# Patient Record
Sex: Male | Born: 1990 | Race: Black or African American | Hispanic: No | Marital: Single | Smoking: Current every day smoker
Health system: Southern US, Community
[De-identification: ages and names within clinical notes are randomized; demographics above are authoritative.]

## PROBLEM LIST (undated history)

## (undated) DIAGNOSIS — S02609A Fracture of mandible, unspecified, initial encounter for closed fracture: Secondary | ICD-10-CM

---
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
## 2010-01-20 ENCOUNTER — Emergency Department (HOSPITAL_COMMUNITY): Admission: EM | Admit: 2010-01-20 | Discharge: 2010-01-20 | Payer: Self-pay | Admitting: Emergency Medicine

## 2012-07-04 DIAGNOSIS — S02609A Fracture of mandible, unspecified, initial encounter for closed fracture: Secondary | ICD-10-CM

## 2012-07-04 HISTORY — DX: Fracture of mandible, unspecified, initial encounter for closed fracture: S02.609A

## 2012-07-05 ENCOUNTER — Observation Stay (HOSPITAL_COMMUNITY)
Admission: EM | Admit: 2012-07-05 | Discharge: 2012-07-06 | Disposition: A | Discharge status: Home / Self Care | Payer: Commercial Indemnity | Attending: Otolaryngology | Admitting: Otolaryngology

## 2012-07-05 ENCOUNTER — Encounter (HOSPITAL_COMMUNITY): Payer: Self-pay | Admitting: Anesthesiology

## 2012-07-05 ENCOUNTER — Encounter (HOSPITAL_COMMUNITY): Payer: Self-pay | Admitting: Emergency Medicine

## 2012-07-05 ENCOUNTER — Encounter (HOSPITAL_COMMUNITY)
Admission: EM | Disposition: A | Discharge status: Home / Self Care | Payer: Self-pay | Source: Home / Self Care | Attending: Emergency Medicine

## 2012-07-05 ENCOUNTER — Emergency Department (HOSPITAL_COMMUNITY): Payer: Commercial Indemnity

## 2012-07-05 ENCOUNTER — Observation Stay (HOSPITAL_COMMUNITY): Payer: Commercial Indemnity | Admitting: Anesthesiology

## 2012-07-05 DIAGNOSIS — S02609A Fracture of mandible, unspecified, initial encounter for closed fracture: Secondary | ICD-10-CM

## 2012-07-05 DIAGNOSIS — S02640A Fracture of ramus of mandible, unspecified side, initial encounter for closed fracture: Principal | ICD-10-CM | POA: Insufficient documentation

## 2012-07-05 DIAGNOSIS — Z23 Encounter for immunization: Secondary | ICD-10-CM | POA: Insufficient documentation

## 2012-07-05 DIAGNOSIS — Y9229 Other specified public building as the place of occurrence of the external cause: Secondary | ICD-10-CM | POA: Insufficient documentation

## 2012-07-05 DIAGNOSIS — F17200 Nicotine dependence, unspecified, uncomplicated: Secondary | ICD-10-CM | POA: Insufficient documentation

## 2012-07-05 DIAGNOSIS — Y998 Other external cause status: Secondary | ICD-10-CM | POA: Insufficient documentation

## 2012-07-05 HISTORY — PX: ORIF MANDIBULAR FRACTURE: SHX2127

## 2012-07-05 HISTORY — PX: CLOSED REDUCTION MANDIBLE WITH MANDIBULOMA: SHX5313

## 2012-07-05 LAB — CBC
HCT: 43.3 % (ref 39.0–52.0)
Hemoglobin: 15 g/dL (ref 13.0–17.0)
MCH: 30.9 pg (ref 26.0–34.0)
MCHC: 34.6 g/dL (ref 30.0–36.0)
MCV: 89.1 fL (ref 78.0–100.0)
Platelets: 194 10*3/uL (ref 150–400)
RBC: 4.86 MIL/uL (ref 4.22–5.81)
RDW: 13.6 % (ref 11.5–15.5)
WBC: 11.8 10*3/uL — ABNORMAL HIGH (ref 4.0–10.5)

## 2012-07-05 LAB — BASIC METABOLIC PANEL
BUN: 7 mg/dL (ref 6–23)
CO2: 25 mEq/L (ref 19–32)
Calcium: 9.7 mg/dL (ref 8.4–10.5)
Chloride: 102 mEq/L (ref 96–112)
Creatinine, Ser: 0.71 mg/dL (ref 0.50–1.35)
GFR calc Af Amer: >90 mL/min (ref >90)
GFR calc non Af Amer: >90 mL/min (ref >90)
Glucose, Bld: 95 mg/dL (ref 70–99)
Potassium: 3.5 mEq/L (ref 3.5–5.1)
Sodium: 141 mEq/L (ref 135–145)

## 2012-07-05 LAB — SURGICAL PCR SCREEN: Staphylococcus aureus: NEGATIVE

## 2012-07-05 SURGERY — OPEN REDUCTION INTERNAL FIXATION (ORIF) MANDIBULAR FRACTURE
Anesthesia: General | Site: Mouth | Wound class: Dirty or Infected

## 2012-07-05 MED ORDER — ONDANSETRON 8 MG PO TBDP
8.0000 mg | ORAL_TABLET | Freq: Once | ORAL | Status: AC
  Administered 2012-07-06: 8 mg via ORAL
  Filled 2012-07-05: qty 1

## 2012-07-05 MED ORDER — PROMETHAZINE HCL 25 MG PO TABS: 25.0000 mg | ORAL_TABLET | Freq: Four times a day (QID) | ORAL | Status: DC | PRN

## 2012-07-05 MED ORDER — ONDANSETRON 4 MG PO TBDP: 4.0000 mg | ORAL_TABLET | Freq: Three times a day (TID) | ORAL | Status: DC | PRN

## 2012-07-05 MED ORDER — HYDROMORPHONE HCL PF 1 MG/ML IJ SOLN: 0.2500 mg | INTRAMUSCULAR | Status: DC | PRN

## 2012-07-05 MED ORDER — PROMETHAZINE HCL 25 MG RE SUPP: 25.0000 mg | Freq: Four times a day (QID) | RECTAL | Status: DC | PRN

## 2012-07-05 MED ORDER — LACTATED RINGERS IV SOLN
INTRAVENOUS | Status: DC | PRN
  Administered 2012-07-05 (×2): via INTRAVENOUS

## 2012-07-05 MED ORDER — OXYMETAZOLINE HCL 0.05 % NA SOLN
NASAL | Status: DC | PRN
  Administered 2012-07-05: 1 via NASAL

## 2012-07-05 MED ORDER — CHLORHEXIDINE GLUCONATE 0.12 % MT SOLN
15.0000 mL | Freq: Four times a day (QID) | OROMUCOSAL | Status: DC
  Administered 2012-07-06 (×2): 15 mL via OROMUCOSAL
  Filled 2012-07-05 (×6): qty 15

## 2012-07-05 MED ORDER — PROPOFOL 10 MG/ML IV BOLUS
INTRAVENOUS | Status: DC | PRN
  Administered 2012-07-05: 270 mg via INTRAVENOUS

## 2012-07-05 MED ORDER — CLINDAMYCIN PHOSPHATE 600 MG/50ML IV SOLN
600.0000 mg | Freq: Once | INTRAVENOUS | Status: AC
  Administered 2012-07-05: 600 mg via INTRAVENOUS
  Filled 2012-07-05: qty 50

## 2012-07-05 MED ORDER — HYDROMORPHONE HCL PF 1 MG/ML IJ SOLN
1.0000 mg | Freq: Once | INTRAMUSCULAR | Status: DC
  Filled 2012-07-05: qty 1

## 2012-07-05 MED ORDER — CHLORHEXIDINE GLUCONATE 0.12 % MT SOLN: 15.0000 mL | Freq: Four times a day (QID) | OROMUCOSAL | Status: DC

## 2012-07-05 MED ORDER — DEXAMETHASONE SODIUM PHOSPHATE 4 MG/ML IJ SOLN
INTRAMUSCULAR | Status: DC | PRN
  Administered 2012-07-05: 10 mg via INTRAVENOUS

## 2012-07-05 MED ORDER — SUCCINYLCHOLINE CHLORIDE 20 MG/ML IJ SOLN
INTRAMUSCULAR | Status: DC | PRN
  Administered 2012-07-05: 100 mg via INTRAVENOUS

## 2012-07-05 MED ORDER — MORPHINE SULFATE 2 MG/ML IJ SOLN
2.0000 mg | INTRAMUSCULAR | Status: DC | PRN
  Administered 2012-07-06: 2 mg via INTRAVENOUS
  Filled 2012-07-05: qty 1

## 2012-07-05 MED ORDER — ONDANSETRON HCL 4 MG/2ML IJ SOLN
INTRAMUSCULAR | Status: DC | PRN
  Administered 2012-07-05: 4 mg via INTRAVENOUS

## 2012-07-05 MED ORDER — ROCURONIUM BROMIDE 100 MG/10ML IV SOLN
INTRAVENOUS | Status: DC | PRN
  Administered 2012-07-05: 50 mg via INTRAVENOUS

## 2012-07-05 MED ORDER — GLYCOPYRROLATE 0.2 MG/ML IJ SOLN
INTRAMUSCULAR | Status: DC | PRN
  Administered 2012-07-05: 0.6 mg via INTRAVENOUS

## 2012-07-05 MED ORDER — CLINDAMYCIN PHOSPHATE 600 MG/50ML IV SOLN
600.0000 mg | Freq: Three times a day (TID) | INTRAVENOUS | Status: DC
  Administered 2012-07-05 – 2012-07-06 (×2): 600 mg via INTRAVENOUS
  Filled 2012-07-05 (×4): qty 50

## 2012-07-05 MED ORDER — ARTIFICIAL TEARS OP OINT
TOPICAL_OINTMENT | OPHTHALMIC | Status: DC | PRN
  Administered 2012-07-05: 1 via OPHTHALMIC

## 2012-07-05 MED ORDER — HYDROCODONE-ACETAMINOPHEN 7.5-500 MG/15ML PO SOLN: 15.0000 mL | Freq: Four times a day (QID) | ORAL | Status: DC | PRN

## 2012-07-05 MED ORDER — NEOSTIGMINE METHYLSULFATE 1 MG/ML IJ SOLN
INTRAMUSCULAR | Status: DC | PRN
  Administered 2012-07-05: 4 mg via INTRAVENOUS

## 2012-07-05 MED ORDER — ONDANSETRON HCL 4 MG/2ML IJ SOLN
4.0000 mg | Freq: Once | INTRAMUSCULAR | Status: AC
  Administered 2012-07-05: 4 mg via INTRAVENOUS
  Filled 2012-07-05: qty 2

## 2012-07-05 MED ORDER — FENTANYL CITRATE 0.05 MG/ML IJ SOLN
INTRAMUSCULAR | Status: DC | PRN
  Administered 2012-07-05: 100 ug via INTRAVENOUS
  Administered 2012-07-05 (×3): 50 ug via INTRAVENOUS

## 2012-07-05 MED ORDER — LACTATED RINGERS IV SOLN
INTRAVENOUS | Status: DC
  Administered 2012-07-05: 17:00:00 via INTRAVENOUS

## 2012-07-05 MED ORDER — HYDROMORPHONE HCL PF 1 MG/ML IJ SOLN
1.0000 mg | Freq: Once | INTRAMUSCULAR | Status: AC
  Administered 2012-07-05: 1 mg via INTRAVENOUS
  Filled 2012-07-05: qty 1

## 2012-07-05 MED ORDER — CLINDAMYCIN HCL 300 MG PO CAPS: 300.0000 mg | ORAL_CAPSULE | Freq: Three times a day (TID) | ORAL | Status: DC

## 2012-07-05 MED ORDER — MIDAZOLAM HCL 5 MG/5ML IJ SOLN
INTRAMUSCULAR | Status: DC | PRN
  Administered 2012-07-05: 2 mg via INTRAVENOUS

## 2012-07-05 MED ORDER — HYDROMORPHONE HCL PF 1 MG/ML IJ SOLN
1.0000 mg | Freq: Once | INTRAMUSCULAR | Status: AC
  Administered 2012-07-05: 1 mg via INTRAVENOUS

## 2012-07-05 MED ORDER — DEXTROSE-NACL 5-0.9 % IV SOLN
INTRAVENOUS | Status: DC
  Administered 2012-07-05: 22:00:00 via INTRAVENOUS

## 2012-07-05 MED ORDER — 0.9 % SODIUM CHLORIDE (POUR BTL) OPTIME
TOPICAL | Status: DC | PRN
  Administered 2012-07-05: 1000 mL

## 2012-07-05 MED ORDER — DIPHENHYDRAMINE HCL 50 MG/ML IJ SOLN
INTRAMUSCULAR | Status: DC | PRN
  Administered 2012-07-05: 12.5 mg via INTRAVENOUS

## 2012-07-05 MED ORDER — DEXTROSE-NACL 5-0.9 % IV SOLN
INTRAVENOUS | Status: DC
  Administered 2012-07-05: 16:00:00 via INTRAVENOUS

## 2012-07-05 MED ORDER — HYDROCODONE-ACETAMINOPHEN 7.5-500 MG/15ML PO SOLN
10.0000 mL | ORAL | Status: DC | PRN
  Administered 2012-07-06: 15 mL via ORAL
  Filled 2012-07-05: qty 15

## 2012-07-05 MED ORDER — TETANUS-DIPHTH-ACELL PERTUSSIS 5-2.5-18.5 LF-MCG/0.5 IM SUSP
0.5000 mL | Freq: Once | INTRAMUSCULAR | Status: AC
  Administered 2012-07-05: 0.5 mL via INTRAMUSCULAR
  Filled 2012-07-05: qty 0.5

## 2012-07-05 MED ORDER — LIDOCAINE-EPINEPHRINE 1 %-1:100000 IJ SOLN
INTRAMUSCULAR | Status: DC | PRN
  Administered 2012-07-05: 20 mL

## 2012-07-05 MED ORDER — LIDOCAINE HCL (CARDIAC) 20 MG/ML IV SOLN
INTRAVENOUS | Status: DC | PRN
  Administered 2012-07-05: 100 mg via INTRAVENOUS

## 2012-07-05 SURGICAL SUPPLY — 41 items
BIT DRILL 1.9MM X 35MM (BIT) ×1 IMPLANT
BIT DRILL 1.9X35 (BIT) ×1
BIT DRILL UPPR FCE 1.0 12 TWST (DRILL) ×1 IMPLANT
CANISTER SUCTION 2500CC (MISCELLANEOUS) ×2 IMPLANT
CLEANER TIP ELECTROSURG 2X2 (MISCELLANEOUS) ×4 IMPLANT
CLOTH BEACON ORANGE TIMEOUT ST (SAFETY) ×2 IMPLANT
COVER SURGICAL LIGHT HANDLE (MISCELLANEOUS) ×4 IMPLANT
DRILL BIT 1.9MM X 35MM (BIT) ×1
DRILL UPPER FACE 1.0 12M TWIST (DRILL) ×2
ELECT COATED BLADE 2.86 ST (ELECTRODE) ×2 IMPLANT
ELECT NEEDLE TIP 2.8 STRL (NEEDLE) IMPLANT
ELECT REM PT RETURN 9FT ADLT (ELECTROSURGICAL)
ELECTRODE REM PT RTRN 9FT ADLT (ELECTROSURGICAL) IMPLANT
GLOVE ECLIPSE 7.5 STRL STRAW (GLOVE) ×2 IMPLANT
GOWN STRL NON-REIN LRG LVL3 (GOWN DISPOSABLE) ×4 IMPLANT
KIT BASIN OR (CUSTOM PROCEDURE TRAY) ×2 IMPLANT
KIT ROOM TURNOVER OR (KITS) ×2 IMPLANT
NEEDLE 27GAX1X1/2 (NEEDLE) ×2 IMPLANT
NS IRRIG 1000ML POUR BTL (IV SOLUTION) ×2 IMPLANT
PAD ARMBOARD 7.5X6 YLW CONV (MISCELLANEOUS) ×4 IMPLANT
PENCIL FOOT CONTROL (ELECTRODE) IMPLANT
PLATE 4 H FRACTURE C SHAPE (Plate) ×2 IMPLANT
SCISSORS WIRE DISP (INSTRUMENTS) ×2 IMPLANT
SCREW LOCK 8X2.3X XPN NS LF (Screw) ×3 IMPLANT
SCREW LOCKING 2.3X10MM (Screw) ×2 IMPLANT
SCREW LOCKING 2.3X8MM (Screw) ×3 IMPLANT
SCREW UPPER FACE 2.0X12MM (Screw) ×4 IMPLANT
SCREW UPPER FACE 2.0X8MM (Screw) ×4 IMPLANT
SUT BONE WAX W31G (SUTURE) ×2 IMPLANT
SUT CHROMIC 3 0 PS 2 (SUTURE) IMPLANT
SUT STEEL 0 (SUTURE)
SUT STEEL 0 18XMFL TIE 17 (SUTURE) IMPLANT
SUT STEEL 2 (SUTURE) ×2 IMPLANT
SUT STEEL 4 (SUTURE) IMPLANT
SUT VIC AB 3-0 FS2 27 (SUTURE) ×2 IMPLANT
SUT VICRYL 4-0 PS2 18IN ABS (SUTURE) IMPLANT
TOWEL OR 17X24 6PK STRL BLUE (TOWEL DISPOSABLE) ×2 IMPLANT
TOWEL OR 17X26 10 PK STRL BLUE (TOWEL DISPOSABLE) ×2 IMPLANT
TRAY ENT MC OR (CUSTOM PROCEDURE TRAY) ×2 IMPLANT
TUBE SALEM SUMP 16 FR W/ARV (TUBING) ×2 IMPLANT
WATER STERILE IRR 1000ML POUR (IV SOLUTION) ×2 IMPLANT
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
## 2012-07-05 NOTE — Preoperative (Signed)
Beta Blockers   Reason not to administer Beta Blockers:Not Applicable 
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
## 2012-07-05 NOTE — ED Notes (Signed)
Patient transported to CT 

## 2012-07-05 NOTE — Progress Notes (Signed)
Values taken at 1710

## 2012-07-05 NOTE — Transfer of Care (Signed)
Immediate Anesthesia Transfer of Care Note  Patient: Cory Cooper  Procedure(s) Performed: Procedure(s) (LRB) with comments: OPEN REDUCTION INTERNAL FIXATION (ORIF) MANDIBULAR FRACTURE (N/A) CLOSED REDUCTION MANDIBLE WITH MANDIBULOMAXILLARY FUSION (N/A)  Patient Location: PACU  Anesthesia Type:General  Level of Consciousness: awake, alert  and oriented  Airway & Oxygen Therapy: Patient Spontanous Breathing and Patient connected to face mask oxygen  Post-op Assessment: Report given to PACU RN, Post -op Vital signs reviewed and stable and Patient moving all extremities X 4  Post vital signs: Reviewed and stable  Complications: No apparent anesthesia complications

## 2012-07-05 NOTE — Anesthesia Procedure Notes (Signed)
Procedure Name: Intubation Date/Time: 07/05/2012 5:28 PM Performed by: Hermelinda Dellen A Pre-anesthesia Checklist: Emergency Drugs available, Patient identified, Suction available, Patient being monitored and Timeout performed Patient Re-evaluated:Patient Re-evaluated prior to inductionOxygen Delivery Method: Circle system utilized Preoxygenation: Pre-oxygenation with 100% oxygen Intubation Type: IV induction Ventilation: Mask ventilation with difficulty and Oral airway inserted - appropriate to patient size Grade View: Grade I Nasal Tubes: Left, Magill forceps- large, utilized, Nasal prep performed and Nasal Rae Number of attempts: 1 Airway Equipment and Method: Video-laryngoscopy Placement Confirmation: ETT inserted through vocal cords under direct vision,  positive ETCO2 and breath sounds checked- equal and bilateral Secured at: 25 cm Tube secured with: Tape Dental Injury: Teeth and Oropharynx as per pre-operative assessment  Comments: Mask ventilation difficult due to facial hair
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
## 2012-07-05 NOTE — Op Note (Signed)
OPERATIVE REPORT  DATE OF SURGERY: 07/05/2012  PATIENT:  Cory Cooper,  22 y.o. male  PRE-OPERATIVE DIAGNOSIS:  mandible fx  POST-OPERATIVE DIAGNOSIS:  mandible fx  PROCEDURE:  Procedure(s): OPEN REDUCTION INTERNAL FIXATION (ORIF) MANDIBULAR FRACTURE CLOSED REDUCTION MANDIBLE WITH MANDIBULOMAXILLARY FUSION  SURGEON:  Susy Frizzle, MD  ASSISTANTS: none  ANESTHESIA:   General   EBL:  100 ml  DRAINS: none  LOCAL MEDICATIONS USED:  None  SPECIMEN:  none  COUNTS:  Correct  PROCEDURE DETAILS: The patient was taken to the operating room and placed on the operating table in the supine position. Following induction of general nasal intubation anesthesia, the face was draped in a standard fashion. The oral cavity was suctioned of blood. The teeth were inspected. There were no loose teeth. The fracture was severely displaced, located in the left parasymphaseal/anterior ramus region. It was a clean fracture. The adjacent teeth were in place and stable. The left mental foramen and corresponding nerve were just anterior to the fracture line.  Bicortical screws were placed, 2  8 mm screws and the maxilla and 2  12 mm screws and mandible. The maxillary screws were placed just lateral to the lateral incisors. The right mandibular screw was placed just lateral to the lateral incisor and the left screw was placed 2 additional piece laterally because of the fracture location. 24-gauge wires were used to bring the dentition into occlusion. Electrocautery was used to incise the mucosa in all 4 quadrants. Electrocautery was also used to create a lower mucosal incision along the gingival labial sulcus. The fracture was identified. Periosteum was cleaned off of the bone exposing the entire fracture with sufficient room to expose and avoid the mental foramen and to place a plate and screws. A Stryker 2.3 mm slightly curved 4 hole compression plate was used with locking screws. The right most screw was  close to the midline of the mandible and was a 10 mm, the 3 remaining screws were 8 mm. Bimanual fracture reduction was accomplished in order to place the plate in a stable fashion with nice reduction of the fracture. The wound was copiously irrigated with saline and then the incision was reapproximated with a running 3-0 Vicryl suture. The oral cavity was rinsed with saline and suctioned and a tooth brush was used to clean all of the old dried blood. A total of 4 wires were used, 2 connecting ipsilaterally and to connecting contralaterally, crossing. There was good occlusion and good fracture stability. A nasogastric tube was used to aspirate the contents of the stomach. The patient was then awakened, extubated and transferred to recovery in stable condition.    PATIENT DISPOSITION:  To PACU, stable

## 2012-07-05 NOTE — ED Provider Notes (Signed)
Medical screening examination/treatment/procedure(s) were conducted as a shared visit with non-physician practitioner(s) and myself.  I personally evaluated the patient during the encounter  Keshana Klemz, MD 07/05/12 1703 
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
## 2012-07-05 NOTE — Anesthesia Postprocedure Evaluation (Signed)
  Anesthesia Post-op Note  Patient: Cory Cooper  Procedure(s) Performed: Procedure(s) (LRB) with comments: OPEN REDUCTION INTERNAL FIXATION (ORIF) MANDIBULAR FRACTURE (N/A) CLOSED REDUCTION MANDIBLE WITH MANDIBULOMAXILLARY FUSION (N/A)  Patient Location: PACU  Anesthesia Type:General  Level of Consciousness: awake and alert   Airway and Oxygen Therapy: Patient Spontanous Breathing  Post-op Pain: none  Post-op Assessment: Post-op Vital signs reviewed, Patient's Cardiovascular Status Stable, Respiratory Function Stable, Patent Airway and No signs of Nausea or vomiting  Post-op Vital Signs: Reviewed and stable  Complications: No apparent anesthesia complications
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
## 2012-07-06 ENCOUNTER — Observation Stay (HOSPITAL_COMMUNITY): Payer: Commercial Indemnity

## 2012-07-06 ENCOUNTER — Encounter (HOSPITAL_COMMUNITY): Payer: Self-pay | Admitting: Otolaryngology

## 2012-07-06 MED ORDER — ONDANSETRON 4 MG PO TBDP: 4.0000 mg | ORAL_TABLET | Freq: Three times a day (TID) | ORAL | Status: DC | PRN

## 2012-07-06 MED ORDER — CLINDAMYCIN HCL 300 MG PO CAPS: 300.0000 mg | ORAL_CAPSULE | Freq: Three times a day (TID) | ORAL | Status: DC

## 2012-07-06 MED ORDER — HYDROCODONE-ACETAMINOPHEN 7.5-500 MG/15ML PO SOLN: 15.0000 mL | Freq: Four times a day (QID) | ORAL | Status: DC | PRN

## 2012-07-06 MED ORDER — PROMETHAZINE HCL 25 MG RE SUPP: 25.0000 mg | Freq: Four times a day (QID) | RECTAL | Status: DC | PRN

## 2012-07-06 MED ORDER — CHLORHEXIDINE GLUCONATE 0.12 % MT SOLN: 15.0000 mL | Freq: Four times a day (QID) | OROMUCOSAL | Status: DC

## 2012-07-06 NOTE — Discharge Summary (Signed)
Physician Discharge Summary  Patient ID: Cory Cooper MRN: [id number] DOB/AGE: 23/04/1991 23 y.o.  Admit date: 07/05/2012 Discharge date: 07/06/2012  Admission Diagnoses:Mandible fracture  Discharge Diagnoses:  Active Problems:  * No active hospital problems. *    Discharged Condition: good  Hospital Course: no complications  Consults: none  Significant Diagnostic Studies: none  Treatments: surgery: ORIF and MMF  Discharge Exam: Blood pressure 114/59, pulse 61, temperature 98.2 F (36.8 C), temperature source Axillary, resp. rate 18, SpO2 98.00%. PHYSICAL EXAM: MMF in place and stable, no trouble breathing, taking po liquids well.   Disposition: Final discharge disposition not confirmed     Medication List     As of 07/06/2012 10:11 AM    TAKE these medications         chlorhexidine 0.12 % solution   Commonly known as: PERIDEX   Use as directed 15 mLs in the mouth or throat 4 (four) times daily.      clindamycin 300 MG capsule   Commonly known as: CLEOCIN   Take 1 capsule (300 mg total) by mouth 3 (three) times daily.      HYDROcodone-acetaminophen 7.5-500 MG/15ML solution   Commonly known as: LORTAB   Take 15 mLs by mouth every 6 (six) hours as needed for pain.      ondansetron 4 MG disintegrating tablet   Commonly known as: ZOFRAN-ODT   Take 1 tablet (4 mg total) by mouth every 8 (eight) hours as needed for nausea.      promethazine 25 MG suppository   Commonly known as: PHENERGAN   Place 1 suppository (25 mg total) rectally every 6 (six) hours as needed for nausea.           Follow-up Information    Follow up with Pollyann Kennedy, Avis Mcmahill, MD. In 1 week.   Contact information:   [address], [address] [place], [place] [address] [place] [address] [phone]          Signed: Serena Colonel 07/06/2012, 10:11 AM

## 2012-08-10 ENCOUNTER — Other Ambulatory Visit (HOSPITAL_COMMUNITY): Payer: Self-pay | Admitting: Otolaryngology

## 2012-08-10 ENCOUNTER — Ambulatory Visit (HOSPITAL_COMMUNITY)
Admission: RE | Admit: 2012-08-10 | Discharge: 2012-08-10 | Disposition: A | Discharge status: Home / Self Care | Payer: Commercial Indemnity | Source: Ambulatory Visit | Attending: Otolaryngology | Admitting: Otolaryngology

## 2012-08-10 ENCOUNTER — Emergency Department (HOSPITAL_COMMUNITY)
Admission: EM | Admit: 2012-08-10 | Discharge: 2012-08-10 | Discharge status: Against Medical Advice | Payer: Commercial Indemnity

## 2012-08-10 DIAGNOSIS — Z4789 Encounter for other orthopedic aftercare: Secondary | ICD-10-CM | POA: Insufficient documentation

## 2012-08-10 DIAGNOSIS — R52 Pain, unspecified: Secondary | ICD-10-CM

## 2012-08-14 ENCOUNTER — Encounter (HOSPITAL_BASED_OUTPATIENT_CLINIC_OR_DEPARTMENT_OTHER): Payer: Self-pay | Admitting: *Deleted

## 2012-08-14 NOTE — H&P (Signed)
Assessment   Mandible open fracture (802.30) (S02.609B). Reason For Visit  Follow up from mandible fracture. Discussed  Doing well, no change on exam.   Reviewed. The fracture is aligned nicely. We'll remove MMF later this week. Allergies  Amoxicillin TABS. Current Meds  Unknown;doesn't know name of meds; RPT. Active Problems  Mandible open fracture  (802.30) (S02.609B). PSH  Treatment Of Mandibular Fracture [id number]. Signature  Electronically signed by : Serena Colonel  M.D.; 08/13/2012 12:50 PM EST.

## 2012-08-15 ENCOUNTER — Ambulatory Visit (HOSPITAL_BASED_OUTPATIENT_CLINIC_OR_DEPARTMENT_OTHER)
Admission: RE | Admit: 2012-08-15 | Discharge: 2012-08-15 | Disposition: A | Discharge status: Home / Self Care | Payer: Managed Care, Other (non HMO) | Source: Ambulatory Visit | Attending: Otolaryngology | Admitting: Otolaryngology

## 2012-08-15 ENCOUNTER — Encounter (HOSPITAL_BASED_OUTPATIENT_CLINIC_OR_DEPARTMENT_OTHER): Payer: Self-pay | Admitting: *Deleted

## 2012-08-15 ENCOUNTER — Encounter (HOSPITAL_BASED_OUTPATIENT_CLINIC_OR_DEPARTMENT_OTHER): Payer: Self-pay | Admitting: Anesthesiology

## 2012-08-15 ENCOUNTER — Ambulatory Visit (HOSPITAL_BASED_OUTPATIENT_CLINIC_OR_DEPARTMENT_OTHER): Payer: Managed Care, Other (non HMO) | Admitting: Anesthesiology

## 2012-08-15 ENCOUNTER — Encounter (HOSPITAL_BASED_OUTPATIENT_CLINIC_OR_DEPARTMENT_OTHER)
Admission: RE | Disposition: A | Discharge status: Home / Self Care | Payer: Self-pay | Source: Ambulatory Visit | Attending: Otolaryngology

## 2012-08-15 DIAGNOSIS — Z4789 Encounter for other orthopedic aftercare: Secondary | ICD-10-CM | POA: Insufficient documentation

## 2012-08-15 DIAGNOSIS — S02609D Fracture of mandible, unspecified, subsequent encounter for fracture with routine healing: Secondary | ICD-10-CM

## 2012-08-15 HISTORY — PX: MANDIBULAR HARDWARE REMOVAL: SHX5205

## 2012-08-15 HISTORY — DX: Fracture of mandible, unspecified, initial encounter for closed fracture: S02.609A

## 2012-08-15 SURGERY — REMOVAL, HARDWARE, MANDIBLE
Anesthesia: Monitor Anesthesia Care | Wound class: Clean Contaminated

## 2012-08-15 MED ORDER — FENTANYL CITRATE 0.05 MG/ML IJ SOLN: 50.0000 ug | INTRAMUSCULAR | Status: DC | PRN

## 2012-08-15 MED ORDER — MIDAZOLAM HCL 2 MG/ML PO SYRP: 0.5000 mg/kg | ORAL_SOLUTION | Freq: Once | ORAL | Status: DC | PRN

## 2012-08-15 MED ORDER — LIDOCAINE HCL (CARDIAC) 20 MG/ML IV SOLN
INTRAVENOUS | Status: DC | PRN
  Administered 2012-08-15: 50 mg via INTRAVENOUS

## 2012-08-15 MED ORDER — HYDROCODONE-ACETAMINOPHEN 7.5-325 MG PO TABS: 1.0000 | ORAL_TABLET | Freq: Four times a day (QID) | ORAL | Status: AC | PRN

## 2012-08-15 MED ORDER — PROPOFOL 10 MG/ML IV BOLUS
INTRAVENOUS | Status: DC | PRN
  Administered 2012-08-15 (×3): 20 mg via INTRAVENOUS

## 2012-08-15 MED ORDER — HYDROMORPHONE HCL PF 1 MG/ML IJ SOLN
0.2500 mg | INTRAMUSCULAR | Status: DC | PRN
  Administered 2012-08-15 (×2): 0.5 mg via INTRAVENOUS

## 2012-08-15 MED ORDER — CLINDAMYCIN HCL 300 MG PO CAPS: 300.0000 mg | ORAL_CAPSULE | Freq: Three times a day (TID) | ORAL | Status: AC

## 2012-08-15 MED ORDER — ONDANSETRON HCL 4 MG/2ML IJ SOLN
INTRAMUSCULAR | Status: DC | PRN
  Administered 2012-08-15: 4 mg via INTRAVENOUS

## 2012-08-15 MED ORDER — CLINDAMYCIN PHOSPHATE 600 MG/50ML IV SOLN
600.0000 mg | Freq: Once | INTRAVENOUS | Status: AC
  Administered 2012-08-15: 600 mg via INTRAVENOUS

## 2012-08-15 MED ORDER — DEXAMETHASONE SODIUM PHOSPHATE 10 MG/ML IJ SOLN
INTRAMUSCULAR | Status: DC | PRN
  Administered 2012-08-15: 10 mg via INTRAVENOUS

## 2012-08-15 MED ORDER — OXYCODONE HCL 5 MG PO TABS
5.0000 mg | ORAL_TABLET | Freq: Once | ORAL | Status: AC | PRN
  Administered 2012-08-15: 5 mg via ORAL

## 2012-08-15 MED ORDER — OXYCODONE HCL 5 MG/5ML PO SOLN: 5.0000 mg | Freq: Once | ORAL | Status: AC | PRN

## 2012-08-15 MED ORDER — MIDAZOLAM HCL 2 MG/2ML IJ SOLN: 1.0000 mg | INTRAMUSCULAR | Status: DC | PRN

## 2012-08-15 MED ORDER — FENTANYL CITRATE 0.05 MG/ML IJ SOLN
INTRAMUSCULAR | Status: DC | PRN
  Administered 2012-08-15 (×4): 50 ug via INTRAVENOUS

## 2012-08-15 MED ORDER — LIDOCAINE-EPINEPHRINE 1 %-1:100000 IJ SOLN
INTRAMUSCULAR | Status: DC | PRN
  Administered 2012-08-15: 3.5 mL

## 2012-08-15 MED ORDER — PROMETHAZINE HCL 25 MG RE SUPP: 25.0000 mg | Freq: Four times a day (QID) | RECTAL | Status: AC | PRN

## 2012-08-15 MED ORDER — MIDAZOLAM HCL 5 MG/5ML IJ SOLN
INTRAMUSCULAR | Status: DC | PRN
  Administered 2012-08-15: 2 mg via INTRAVENOUS

## 2012-08-15 MED ORDER — BACITRACIN-NEOMYCIN-POLYMYXIN 400-5-5000 EX OINT
TOPICAL_OINTMENT | CUTANEOUS | Status: DC | PRN
  Administered 2012-08-15: 1 via TOPICAL

## 2012-08-15 MED ORDER — LACTATED RINGERS IV SOLN
INTRAVENOUS | Status: DC
  Administered 2012-08-15 (×2): via INTRAVENOUS

## 2012-08-15 MED ORDER — ONDANSETRON HCL 4 MG/2ML IJ SOLN: 4.0000 mg | Freq: Once | INTRAMUSCULAR | Status: DC | PRN

## 2012-08-15 SURGICAL SUPPLY — 30 items
BLADE SURG 15 STRL LF DISP TIS (BLADE) ×1 IMPLANT
BLADE SURG 15 STRL SS (BLADE) ×1
CANISTER SUCTION 1200CC (MISCELLANEOUS) ×2 IMPLANT
CLOTH BEACON ORANGE TIMEOUT ST (SAFETY) ×2 IMPLANT
COVER MAYO STAND STRL (DRAPES) ×2 IMPLANT
DECANTER SPIKE VIAL GLASS SM (MISCELLANEOUS) IMPLANT
ELECT COATED BLADE 2.86 ST (ELECTRODE) IMPLANT
ELECT REM PT RETURN 9FT ADLT (ELECTROSURGICAL)
ELECTRODE REM PT RTRN 9FT ADLT (ELECTROSURGICAL) IMPLANT
GAUZE SPONGE 4X4 12PLY STRL LF (GAUZE/BANDAGES/DRESSINGS) IMPLANT
GAUZE SPONGE 4X4 16PLY XRAY LF (GAUZE/BANDAGES/DRESSINGS) IMPLANT
GLOVE ECLIPSE 7.5 STRL STRAW (GLOVE) ×2 IMPLANT
GLOVE SKINSENSE NS SZ7.0 (GLOVE) ×1
GLOVE SKINSENSE STRL SZ7.0 (GLOVE) ×1 IMPLANT
GOWN PREVENTION PLUS XLARGE (GOWN DISPOSABLE) ×4 IMPLANT
MARKER SKIN DUAL TIP RULER LAB (MISCELLANEOUS) IMPLANT
NEEDLE 27GAX1X1/2 (NEEDLE) ×2 IMPLANT
NS IRRIG 1000ML POUR BTL (IV SOLUTION) IMPLANT
PACK BASIN DAY SURGERY FS (CUSTOM PROCEDURE TRAY) ×2 IMPLANT
PENCIL FOOT CONTROL (ELECTRODE) IMPLANT
SCISSORS WIRE ANG 4 3/4 DISP (INSTRUMENTS) IMPLANT
SHEET MEDIUM DRAPE 40X70 STRL (DRAPES) ×2 IMPLANT
SUT CHROMIC 3 0 PS 2 (SUTURE) IMPLANT
SUT CHROMIC 4 0 PS 2 18 (SUTURE) ×2 IMPLANT
SYR CONTROL 10ML LL (SYRINGE) ×2 IMPLANT
TOWEL OR 17X24 6PK STRL BLUE (TOWEL DISPOSABLE) ×2 IMPLANT
TRAY DSU PREP LF (CUSTOM PROCEDURE TRAY) IMPLANT
TUBE CONNECTING 20X1/4 (TUBING) ×2 IMPLANT
WATER STERILE IRR 1000ML POUR (IV SOLUTION) IMPLANT
YANKAUER SUCT BULB TIP NO VENT (SUCTIONS) ×2 IMPLANT

## 2012-08-15 NOTE — Op Note (Signed)
OPERATIVE REPORT  DATE OF SURGERY: 08/15/2012  PATIENT:  Cory Cooper,  22 y.o. male  PRE-OPERATIVE DIAGNOSIS:  MANDIBAL FRACTURE  POST-OPERATIVE DIAGNOSIS:  MANDIBAL FRACTURE  PROCEDURE:  Procedure(s): MANDIBULAR HARDWARE REMOVAL  SURGEON:  Susy Frizzle, MD  ASSISTANTS: none  ANESTHESIA:   General   EBL:  10 ml  DRAINS: none  LOCAL MEDICATIONS USED:  1% Xylocaine with epinephrine  SPECIMEN:  none  COUNTS:  Correct  PROCEDURE DETAILS: The patient was taken to the operating room and placed on the operating table in the supine position. Following induction of intravenous sedation using propofol, the patient was draped in a standard fashion. 4 quadrants were infiltrated with Xylocaine with epinephrine overlying screws. 15 scalpel was used to incise mucosa and dissected into the screws. All 4 screws were exposed. The wires were cut to release the MMF. The screws were removed one at a time, removing all wires as well. A single 4-0 chromic suture was used on the mandible on both sides but none were used on the maxilla. He tolerated this well, was awakened and transferred to recovery in stable condition.    PATIENT DISPOSITION:  To PACU, stable

## 2012-08-15 NOTE — Transfer of Care (Signed)
Immediate Anesthesia Transfer of Care Note  Patient: Cory Cooper  Procedure(s) Performed: Procedure(s): MANDIBULAR HARDWARE REMOVAL (N/A)  Patient Location: PACU  Anesthesia Type:MAC  Level of Consciousness: awake and alert   Airway & Oxygen Therapy: Patient Spontanous Breathing and Patient connected to face mask oxygen  Post-op Assessment: Report given to PACU RN and Post -op Vital signs reviewed and stable  Post vital signs: Reviewed and stable  Complications: No apparent anesthesia complications

## 2012-08-15 NOTE — Anesthesia Postprocedure Evaluation (Signed)
  Anesthesia Post-op Note  Patient: Cory Cooper  Procedure(s) Performed: Procedure(s): MANDIBULAR HARDWARE REMOVAL (N/A)  Patient Location: PACU  Anesthesia Type:MAC  Level of Consciousness: awake, alert  and oriented  Airway and Oxygen Therapy: Patient Spontanous Breathing  Post-op Pain: mild  Post-op Assessment: Post-op Vital signs reviewed  Post-op Vital Signs: Reviewed  Complications: No apparent anesthesia complications
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
## 2012-08-15 NOTE — Interval H&P Note (Signed)
History and Physical Interval Note:  08/15/2012 12:10 PM  Cory Cooper  has presented today for surgery, with the diagnosis of MANDIBAL FRACTURE  The various methods of treatment have been discussed with the patient and family. After consideration of risks, benefits and other options for treatment, the patient has consented to  Procedure(s): MANDIBULAR HARDWARE REMOVAL (N/A) as a surgical intervention .  The patient's history has been reviewed, patient examined, no change in status, stable for surgery.  I have reviewed the patient's chart and labs.  Questions were answered to the patient's satisfaction.     [name]

## 2012-08-16 ENCOUNTER — Encounter (HOSPITAL_BASED_OUTPATIENT_CLINIC_OR_DEPARTMENT_OTHER): Payer: Self-pay | Admitting: Otolaryngology
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
## 2015-06-10 ENCOUNTER — Emergency Department (HOSPITAL_COMMUNITY): Payer: Managed Care, Other (non HMO)

## 2015-06-10 ENCOUNTER — Encounter (HOSPITAL_COMMUNITY): Payer: Self-pay | Admitting: Emergency Medicine

## 2015-06-10 ENCOUNTER — Emergency Department (HOSPITAL_COMMUNITY)
Admission: EM | Admit: 2015-06-10 | Discharge: 2015-06-10 | Disposition: A | Discharge status: Home / Self Care | Payer: Managed Care, Other (non HMO) | Attending: Emergency Medicine | Admitting: Emergency Medicine

## 2015-06-10 DIAGNOSIS — R05 Cough: Secondary | ICD-10-CM | POA: Diagnosis present

## 2015-06-10 DIAGNOSIS — Z792 Long term (current) use of antibiotics: Secondary | ICD-10-CM | POA: Insufficient documentation

## 2015-06-10 DIAGNOSIS — Z8781 Personal history of (healed) traumatic fracture: Secondary | ICD-10-CM | POA: Insufficient documentation

## 2015-06-10 DIAGNOSIS — Z880 Allergy status to penicillin: Secondary | ICD-10-CM | POA: Insufficient documentation

## 2015-06-10 DIAGNOSIS — F17210 Nicotine dependence, cigarettes, uncomplicated: Secondary | ICD-10-CM | POA: Diagnosis not present

## 2015-06-10 DIAGNOSIS — J189 Pneumonia, unspecified organism: Secondary | ICD-10-CM | POA: Insufficient documentation

## 2015-06-10 DIAGNOSIS — J181 Lobar pneumonia, unspecified organism: Secondary | ICD-10-CM

## 2015-06-10 MED ORDER — AZITHROMYCIN 250 MG PO TABS: 250.0000 mg | ORAL_TABLET | Freq: Every day | ORAL | Status: AC

## 2015-06-10 MED ORDER — ACETAMINOPHEN 500 MG PO TABS
1000.0000 mg | ORAL_TABLET | Freq: Once | ORAL | Status: AC
  Administered 2015-06-10: 1000 mg via ORAL
  Filled 2015-06-10: qty 2

## 2015-06-10 MED ORDER — IBUPROFEN 800 MG PO TABS
800.0000 mg | ORAL_TABLET | Freq: Once | ORAL | Status: DC
  Filled 2015-06-10: qty 1

## 2015-06-10 MED ORDER — CEPHALEXIN 500 MG PO CAPS: 500.0000 mg | ORAL_CAPSULE | Freq: Four times a day (QID) | ORAL | Status: AC

## 2015-06-10 MED ORDER — BENZONATATE 100 MG PO CAPS: 100.0000 mg | ORAL_CAPSULE | Freq: Three times a day (TID) | ORAL | Status: DC

## 2015-06-10 MED ORDER — BENZONATATE 100 MG PO CAPS
100.0000 mg | ORAL_CAPSULE | Freq: Once | ORAL | Status: AC
Start: 2015-06-10 — End: 2015-06-10
  Administered 2015-06-10: 100 mg via ORAL
  Filled 2015-06-10: qty 1

## 2015-06-10 NOTE — ED Provider Notes (Signed)
CSN: [id number]     Arrival date & time 06/10/15  0930 History   First MD Initiated Contact with Patient 06/10/15 [phone]     Chief Complaint  Patient presents with  . Cough     (Consider location/radiation/quality/duration/timing/severity/associated sxs/prior Treatment) Patient is a 24 y.o. male presenting with cough and general illness. The history is provided by the patient.  Cough Associated symptoms: chills, fever and myalgias   Associated symptoms: no chest pain, no eye discharge, no headaches, no rash and no shortness of breath   Illness Severity:  Moderate Onset quality:  Sudden Duration:  1 week Timing:  Constant Progression:  Worsening Chronicity:  New Associated symptoms: cough, fever and myalgias   Associated symptoms: no abdominal pain, no chest pain, no congestion, no diarrhea, no headaches, no rash, no shortness of breath and no vomiting     24 yo M with a chief complaints of cough congestion fevers and chills. This been going on for about a week. Patient now having all over myalgias as well. Subjective shortness of breath. Denies sick contacts. Denies significant medical history.  Past Medical History  Diagnosis Date  . Mandible fracture (HCC) 07/04/2012    retained hardware - limited mouth opening   Past Surgical History  Procedure Laterality Date  . Orif mandibular fracture  07/05/2012    Procedure: OPEN REDUCTION INTERNAL FIXATION (ORIF) MANDIBULAR FRACTURE;  Surgeon: Serena Colonel, MD;  Location: [facility] OR;  Service: ENT;  Laterality: N/A;  . Closed reduction mandible with mandibuloma  07/05/2012    Procedure: CLOSED REDUCTION MANDIBLE WITH MANDIBULOMAXILLARY FUSION;  Surgeon: Serena Colonel, MD;  Location: [facility] OR;  Service: ENT;  Laterality: N/A;  . Mandibular hardware removal N/A 08/15/2012    Procedure: MANDIBULAR HARDWARE REMOVAL;  Surgeon: Serena Colonel, MD;  Location: [facility];  Service: ENT;  Laterality: N/A;   No family history on file. Social  History  Substance Use Topics  . Smoking status: Current Every Day Smoker -- 6 years    Types: Cigarettes  . Smokeless tobacco: Never Used     Comment: 1-2 cig./day  . Alcohol Use: Yes     Comment: occasionally    Review of Systems  Constitutional: Positive for fever and chills.  HENT: Negative for congestion and facial swelling.   Eyes: Negative for discharge and visual disturbance.  Respiratory: Positive for cough. Negative for shortness of breath.   Cardiovascular: Negative for chest pain and palpitations.  Gastrointestinal: Negative for vomiting, abdominal pain and diarrhea.  Musculoskeletal: Positive for myalgias. Negative for arthralgias.  Skin: Negative for color change and rash.  Neurological: Negative for tremors, syncope and headaches.  Psychiatric/Behavioral: Negative for confusion and dysphoric mood.      Allergies  Penicillins  Home Medications   Prior to Admission medications   Medication Sig Start Date End Date Taking? Authorizing Provider  azithromycin (ZITHROMAX) 250 MG tablet Take 1 tablet (250 mg total) by mouth daily. Take first 2 tablets together, then 1 every day until finished. 06/10/15   Melene Plan, DO  benzonatate (TESSALON) 100 MG capsule Take 1 capsule (100 mg total) by mouth every 8 (eight) hours. 06/10/15   Melene Plan, DO  cephALEXin (KEFLEX) 500 MG capsule Take 1 capsule (500 mg total) by mouth 4 (four) times daily. 06/10/15   Melene Plan, DO  clindamycin (CLEOCIN) 300 MG capsule Take 1 capsule (300 mg total) by mouth 3 (three) times daily. 08/15/12   Serena Colonel, MD  HYDROcodone-acetaminophen (NORCO) 7.5-325 MG per  tablet Take 1 tablet by mouth every 6 (six) hours as needed for pain. 08/15/12   Serena ColonelJefry Rosen, MD  promethazine (PHENERGAN) 25 MG suppository Place 1 suppository (25 mg total) rectally every 6 (six) hours as needed for nausea. 08/15/12   Serena ColonelJefry Rosen, MD   BP 136/82 mmHg  Pulse 84  Temp(Src) 98.9 F (37.2 C) (Oral)  Resp 18  Ht 6\' 2"  (1.88  m)  Wt 221 lb (100.245 kg)  BMI 28.36 kg/m2  SpO2 96% Physical Exam  Constitutional: He is oriented to person, place, and time. He appears well-developed and well-nourished.  HENT:  Head: Normocephalic and atraumatic.  Posterior nasal drip.  No anterior cervical lympadenopathy  Eyes: EOM are normal. Pupils are equal, round, and reactive to light.  Neck: Normal range of motion. Neck supple. No JVD present.  Cardiovascular: Normal rate and regular rhythm.  Exam reveals no gallop and no friction rub.   No murmur heard. Pulmonary/Chest: No respiratory distress. He has no wheezes. He has no rales.  Abdominal: He exhibits no distension. There is no rebound and no guarding.  Musculoskeletal: Normal range of motion.  Neurological: He is alert and oriented to person, place, and time.  Skin: No rash noted. No pallor.  Psychiatric: He has a normal mood and affect. His behavior is normal.  Nursing note and vitals reviewed.   ED Course  Procedures (including critical care time) Labs Review Labs Reviewed - No data to display  Imaging Review Dg Chest 2 View  06/10/2015  CLINICAL DATA:  Shortness of breath and productive cough EXAM: CHEST - 2 VIEW COMPARISON:  None. FINDINGS: The cardiac shadow is within normal limits. The lungs are well aerated bilaterally. Increased density is noted in the anterior aspect of the left lower lobe best appreciated on the lateral projection consistent with acute infiltrate. No other focal abnormality is seen. No bony abnormality is noted. IMPRESSION: Left lower lobe pneumonia. Electronically Signed   By: Alcide CleverMark  Lukens M.D.   On: 06/10/2015 10:07   I have personally reviewed and evaluated these images and lab results as part of my medical decision-making.   EKG Interpretation None      MDM   Final diagnoses:  Left lower lobe pneumonia    24 yo M with a chief complaint of a URI. Chest x-ray concerning for left lower lobe pneumonia. Well appearing, non toxic.  We'll treat with azithromycin and Keflex. PCP follow-up.  10:28 AM:  I have discussed the diagnosis/risks/treatment options with the patient and believe the pt to be eligible for discharge home to follow-up with PCP. We also discussed returning to the ED immediately if new or worsening sx occur. We discussed the sx which are most concerning (e.g., sudden worsening sob, inability to tolerate PO) that necessitate immediate return. Medications administered to the patient during their visit and any new prescriptions provided to the patient are listed below.  Medications given during this visit Medications  ibuprofen (ADVIL,MOTRIN) tablet 800 mg (800 mg Oral Not Given 06/10/15 1019)  acetaminophen (TYLENOL) tablet 1,000 mg (1,000 mg Oral Given 06/10/15 1018)  benzonatate (TESSALON) capsule 100 mg (100 mg Oral Given 06/10/15 1018)    New Prescriptions   AZITHROMYCIN (ZITHROMAX) 250 MG TABLET    Take 1 tablet (250 mg total) by mouth daily. Take first 2 tablets together, then 1 every day until finished.   BENZONATATE (TESSALON) 100 MG CAPSULE    Take 1 capsule (100 mg total) by mouth every 8 (eight) hours.  CEPHALEXIN (KEFLEX) 500 MG CAPSULE    Take 1 capsule (500 mg total) by mouth 4 (four) times daily.    The patient appears reasonably screen and/or stabilized for discharge and I doubt any other medical condition or other [facility] requiring further screening, evaluation, or treatment in the ED at this time prior to discharge.     Melene Plan, DO 06/10/15 1029

## 2015-06-10 NOTE — Discharge Instructions (Signed)
Take tylenol 2 pills 4 times a day and motrin 4 pills 3 times a day.  Drink plenty of fluids.  Return for worsening shortness of breath, headache, confusion. Follow up with your family doctor.  ° °Community-Acquired Pneumonia, Adult °Pneumonia is an infection of the lungs. One type of pneumonia can happen while a person is in a hospital. A different type can happen when a person is not in a hospital (community-acquired pneumonia). It is easy for this kind to spread from person to person. It can spread to you if you breathe near an infected person who coughs or sneezes. Some symptoms include: °· A dry cough. °· A wet (productive) cough. °· Fever. °· Sweating. °· Chest pain. °HOME CARE °· Take over-the-counter and prescription medicines only as told by your doctor. °¨ Only take cough medicine if you are losing sleep. °¨ If you were prescribed an antibiotic medicine, take it as told by your doctor. Do not stop taking the antibiotic even if you start to feel better. °· Sleep with your head and neck raised (elevated). You can do this by putting a few pillows under your head, or you can sleep in a recliner. °· Do not use tobacco products. These include cigarettes, chewing tobacco, and e-cigarettes. If you need help quitting, ask your doctor. °· Drink enough water to keep your pee (urine) clear or pale yellow. °A shot (vaccine) can help prevent pneumonia. Shots are often suggested for: °· People older than 25 years of age. °· People older than 25 years of age: °¨ Who are having cancer treatment. °¨ Who have long-term (chronic) lung disease. °¨ Who have problems with their body's defense system (immune system). °You may also prevent pneumonia if you take these actions: °· Get the flu (influenza) shot every year. °· Go to the dentist as often as told. °· Wash your hands often. If soap and water are not available, use hand sanitizer. °GET HELP IF: °· You have a fever. °· You lose sleep because your cough medicine does not  help. °GET HELP RIGHT AWAY IF: °· You are short of breath and it gets worse. °· You have more chest pain. °· Your sickness gets worse. This is very serious if: °¨ You are an older adult. °¨ Your body's defense system is weak. °· You cough up blood. °  °This information is not intended to replace advice given to you by your health care provider. Make sure you discuss any questions you have with your health care provider. °  °Document Released: 11/23/2007 Document Revised: 02/25/2015 Document Reviewed: 10/01/2014 °Elsevier Interactive Patient Education ©2016 [employer] ° °

## 2015-06-10 NOTE — ED Notes (Signed)
C/o 1 week of chills/body aches which have improved, continues with productive cough, no other complaints, no pain, NAD

## 2018-08-12 ENCOUNTER — Encounter (HOSPITAL_COMMUNITY): Payer: Self-pay

## 2018-08-12 ENCOUNTER — Emergency Department (HOSPITAL_COMMUNITY)
Admission: EM | Admit: 2018-08-12 | Discharge: 2018-08-12 | Disposition: A | Discharge status: Home / Self Care | Payer: BLUE CROSS/BLUE SHIELD | Attending: Emergency Medicine | Admitting: Emergency Medicine

## 2018-08-12 ENCOUNTER — Other Ambulatory Visit: Payer: Self-pay

## 2018-08-12 ENCOUNTER — Emergency Department (HOSPITAL_COMMUNITY): Payer: BLUE CROSS/BLUE SHIELD

## 2018-08-12 DIAGNOSIS — W208XXA Other cause of strike by thrown, projected or falling object, initial encounter: Secondary | ICD-10-CM | POA: Insufficient documentation

## 2018-08-12 DIAGNOSIS — Z23 Encounter for immunization: Secondary | ICD-10-CM | POA: Insufficient documentation

## 2018-08-12 DIAGNOSIS — J069 Acute upper respiratory infection, unspecified: Secondary | ICD-10-CM

## 2018-08-12 DIAGNOSIS — S61411A Laceration without foreign body of right hand, initial encounter: Secondary | ICD-10-CM | POA: Insufficient documentation

## 2018-08-12 DIAGNOSIS — Y999 Unspecified external cause status: Secondary | ICD-10-CM | POA: Insufficient documentation

## 2018-08-12 DIAGNOSIS — B9789 Other viral agents as the cause of diseases classified elsewhere: Secondary | ICD-10-CM

## 2018-08-12 DIAGNOSIS — F17210 Nicotine dependence, cigarettes, uncomplicated: Secondary | ICD-10-CM | POA: Insufficient documentation

## 2018-08-12 DIAGNOSIS — Y929 Unspecified place or not applicable: Secondary | ICD-10-CM | POA: Insufficient documentation

## 2018-08-12 DIAGNOSIS — R0602 Shortness of breath: Secondary | ICD-10-CM

## 2018-08-12 DIAGNOSIS — Y9389 Activity, other specified: Secondary | ICD-10-CM | POA: Diagnosis not present

## 2018-08-12 MED ORDER — IBUPROFEN 800 MG PO TABS
800.0000 mg | ORAL_TABLET | Freq: Once | ORAL | Status: AC
  Administered 2018-08-12: 800 mg via ORAL
  Filled 2018-08-12: qty 1

## 2018-08-12 MED ORDER — BENZONATATE 100 MG PO CAPS: 100.0000 mg | ORAL_CAPSULE | Freq: Three times a day (TID) | ORAL | 0 refills | Status: AC | PRN

## 2018-08-12 MED ORDER — IBUPROFEN 800 MG PO TABS: 800.0000 mg | ORAL_TABLET | Freq: Three times a day (TID) | ORAL | 0 refills | Status: AC | PRN

## 2018-08-12 NOTE — ED Notes (Signed)
Patient transported to X-ray 

## 2018-08-12 NOTE — ED Provider Notes (Signed)
[facility] EMERGENCY DEPARTMENT Provider Note   CSN: [id number] Arrival date & time: 08/12/18  1148    History   Chief Complaint Chief Complaint  Patient presents with  . Cough  . Headache    HPI Cory Cooper is a 28 y.o. male.     The history is provided by the patient and medical records. No language interpreter was used.  Cough  Associated symptoms: fever (Subjective), headaches and myalgias   Associated symptoms: no shortness of breath and no sore throat   Headache  Associated symptoms: congestion, cough, fever (Subjective) and myalgias   Associated symptoms: no sore throat     Cory Cooper is a 28 y.o. male  with no pertinent PMH who presents to the Emergency Department complaining of generalized body aches, chills, subjective fever for the last 4 days.  Associated with headache and intermittently productive cough, however mostly dry.  Also experiencing nasal congestion.  Tried extra strength Tylenol which did provide some relief.  Does not know of any sick contacts.  Did not get his flu shot.  Denies any chest pain or trouble breathing.  No sore throat.   Past Medical History:  Diagnosis Date  . Mandible fracture (HCC) 07/04/2012   retained hardware - limited mouth opening    There are no active problems to display for this patient.   Past Surgical History:  Procedure Laterality Date  . CLOSED REDUCTION MANDIBLE WITH MANDIBULOMA  07/05/2012   Procedure: CLOSED REDUCTION MANDIBLE WITH MANDIBULOMAXILLARY FUSION;  Surgeon: Serena Colonel, MD;  Location: [facility] OR;  Service: ENT;  Laterality: N/A;  . MANDIBULAR HARDWARE REMOVAL N/A 08/15/2012   Procedure: MANDIBULAR HARDWARE REMOVAL;  Surgeon: Serena Colonel, MD;  Location: [facility];  Service: ENT;  Laterality: N/A;  . ORIF MANDIBULAR FRACTURE  07/05/2012   Procedure: OPEN REDUCTION INTERNAL FIXATION (ORIF) MANDIBULAR FRACTURE;  Surgeon: Serena Colonel, MD;  Location: [facility] OR;   Service: ENT;  Laterality: N/A;        Home Medications    Prior to Admission medications   Medication Sig Start Date End Date Taking? Authorizing Provider  azithromycin (ZITHROMAX) 250 MG tablet Take 1 tablet (250 mg total) by mouth daily. Take first 2 tablets together, then 1 every day until finished. 06/10/15   Melene Plan, DO  benzonatate (TESSALON) 100 MG capsule Take 1 capsule (100 mg total) by mouth 3 (three) times daily as needed for cough. 08/12/18   Venetia Prewitt, Chase Picket, PA-C  cephALEXin (KEFLEX) 500 MG capsule Take 1 capsule (500 mg total) by mouth 4 (four) times daily. 06/10/15   Melene Plan, DO  clindamycin (CLEOCIN) 300 MG capsule Take 1 capsule (300 mg total) by mouth 3 (three) times daily. 08/15/12   Serena Colonel, MD  HYDROcodone-acetaminophen (NORCO) 7.5-325 MG per tablet Take 1 tablet by mouth every 6 (six) hours as needed for pain. 08/15/12   Serena Colonel, MD  ibuprofen (ADVIL,MOTRIN) 800 MG tablet Take 1 tablet (800 mg total) by mouth every 8 (eight) hours as needed for mild pain or moderate pain. 08/12/18   Noelani Harbach, Chase Picket, PA-C  promethazine (PHENERGAN) 25 MG suppository Place 1 suppository (25 mg total) rectally every 6 (six) hours as needed for nausea. 08/15/12   Serena Colonel, MD    Family History History reviewed. No pertinent family history.  Social History Social History   Tobacco Use  . Smoking status: Current Every Day Smoker    Years: 6.00    Types: Cigarettes  .  Smokeless tobacco: Never Used  . Tobacco comment: 1-2 cig./day  Substance Use Topics  . Alcohol use: Yes    Comment: occasionally  . Drug use: No     Allergies   Penicillins   Review of Systems Review of Systems  Constitutional: Positive for fever (Subjective).  HENT: Positive for congestion. Negative for sore throat.   Respiratory: Positive for cough. Negative for shortness of breath.   Musculoskeletal: Positive for myalgias.  Neurological: Positive for headaches.  All other systems  reviewed and are negative.    Physical Exam Updated Vital Signs BP 109/66 (BP Location: Right Arm)   Pulse 82   Temp 98.8 F (37.1 C) (Oral)   Resp 19   Ht  (1.88 m)   Wt 104.3 kg   SpO2 99%   BMI 29.53 kg/m   Physical Exam Vitals signs and nursing note reviewed.  Constitutional:      General: He is not in acute distress.    Appearance: He is well-developed.     Comments: Nontoxic-appearing.  HENT:     Head: Normocephalic and atraumatic.     Mouth/Throat:     Comments: + PND.  No tonsillar hypertrophy or exudates. Neck:     Musculoskeletal: Neck supple.  Cardiovascular:     Rate and Rhythm: Normal rate and regular rhythm.     Heart sounds: Normal heart sounds. No murmur.  Pulmonary:     Effort: Pulmonary effort is normal. No respiratory distress.     Breath sounds: Normal breath sounds.     Comments: Lungs clear to auscultation bilaterally. Abdominal:     General: There is no distension.     Palpations: Abdomen is soft.     Tenderness: There is no abdominal tenderness.  Skin:    General: Skin is warm and dry.  Neurological:     Mental Status: He is alert and oriented to person, place, and time.      ED Treatments / Results  Labs (all labs ordered are listed, but only abnormal results are displayed) Labs Reviewed - No data to display  EKG None  Radiology Dg Chest 2 View  Result Date: 08/12/2018 CLINICAL DATA:  Short of breath EXAM: CHEST - 2 VIEW COMPARISON:  06/10/2015 FINDINGS: The heart size and mediastinal contours are within normal limits. Both lungs are clear. The visualized skeletal structures are unremarkable. IMPRESSION: No active cardiopulmonary disease. Electronically Signed   By: Marlan Palau M.D.   On: 08/12/2018 14:23    Procedures Procedures (including critical care time)  Medications Ordered in ED Medications  ibuprofen (ADVIL,MOTRIN) tablet 800 mg (has no administration in time range)     Initial Impression / Assessment and  Plan / ED Course  I have reviewed the triage vital signs and the nursing notes.  Pertinent labs & imaging results that were available during my care of the patient were reviewed by me and considered in my medical decision making (see chart for details).       TRAVELLE MCCLIMANS is a 28 y.o. male who presents to ED for cough, congestion, body aches and subjective fevers for the last 4 days.   On exam, patient is afebrile, non-toxic appearing with a clear lung exam. Mild rhinorrhea and OP with no exudates or tonsillar hypertrophy.  CXR negative.  No pneumonia.  Sxs today likely due to viral URI.Symptomatic home care instructions discussed. Rx for Tessalon, ibuprofen given. PCP follow up strongly encouraged if symptoms persist. Reasons to return to ER  discussed. All questions answered.   Blood pressure 109/66, pulse 82, temperature 98.8 F (37.1 C), temperature source Oral, resp. rate 19, height 6\' 2"  (1.88 m), weight 104.3 kg, SpO2 99 %.   Final Clinical Impressions(s) / ED Diagnoses   Final diagnoses:  Shortness of breath  Viral URI with cough    ED Discharge Orders         Ordered    ibuprofen (ADVIL,MOTRIN) 800 MG tablet  Every 8 hours PRN     08/12/18 1500    benzonatate (TESSALON) 100 MG capsule  3 times daily PRN     08/12/18 1500           Soliana Kitko, Chase Picket, PA-C 08/12/18 1506    Arby Barrette, MD 08/22/18 1200

## 2018-08-12 NOTE — ED Triage Notes (Signed)
Onset 3 days body aches, chills. Onset 2 days ago headache, dry cough.  Cough interfering with sleep and activities.  NO respiratory or swallowing difficulties. Taking Tylenol ES with some relief in pain. NO headache at this time.

## 2018-08-12 NOTE — Discharge Instructions (Addendum)
It was my pleasure taking care of you today!   Your symptoms are likely due to a viral upper respiratory infection. Fortunately, we did not see evidence of serious infection and can treat your symptoms. Flonase and mucinex can both help with nasal congestion, tessalon as needed for cough. Alternate between Tylenol and ibuprofen as needed for body aches / fevers.   Rest, drink plenty of fluids to be sure you are staying hydrated.   Please follow up with your primary doctor for discussion of your diagnoses and further evaluation after today's visit if symptoms persist longer than 7 days; Return to the ER for high fevers, difficulty breathing or other concerning symptoms
